# Patient Record
Sex: Male | Born: 1965 | Race: White | Hispanic: No | Marital: Married | Smoking: Current every day smoker
Health system: Southern US, Community
[De-identification: ages and names within clinical notes are randomized; demographics above are authoritative.]

## PROBLEM LIST (undated history)

## (undated) DIAGNOSIS — C801 Malignant (primary) neoplasm, unspecified: Secondary | ICD-10-CM

---

## 2000-08-19 HISTORY — PX: BRAIN TUMOR EXCISION: SHX577

## 2010-08-19 HISTORY — PX: BRAIN SURGERY: SHX531

## 2011-08-09 DIAGNOSIS — Z86010 Personal history of colonic polyps: Secondary | ICD-10-CM | POA: Insufficient documentation

## 2011-10-27 DIAGNOSIS — G4733 Obstructive sleep apnea (adult) (pediatric): Secondary | ICD-10-CM | POA: Insufficient documentation

## 2011-10-27 DIAGNOSIS — Z9989 Dependence on other enabling machines and devices: Secondary | ICD-10-CM | POA: Insufficient documentation

## 2011-11-04 DIAGNOSIS — D481 Neoplasm of uncertain behavior of connective and other soft tissue: Secondary | ICD-10-CM | POA: Insufficient documentation

## 2012-02-23 DIAGNOSIS — H4900 Third [oculomotor] nerve palsy, unspecified eye: Secondary | ICD-10-CM | POA: Insufficient documentation

## 2012-03-11 DIAGNOSIS — J309 Allergic rhinitis, unspecified: Secondary | ICD-10-CM | POA: Insufficient documentation

## 2012-06-24 DIAGNOSIS — E230 Hypopituitarism: Secondary | ICD-10-CM | POA: Insufficient documentation

## 2012-07-27 DIAGNOSIS — K648 Other hemorrhoids: Secondary | ICD-10-CM | POA: Insufficient documentation

## 2012-09-03 DIAGNOSIS — H4900 Third [oculomotor] nerve palsy, unspecified eye: Secondary | ICD-10-CM | POA: Insufficient documentation

## 2012-10-05 DIAGNOSIS — Z982 Presence of cerebrospinal fluid drainage device: Secondary | ICD-10-CM | POA: Insufficient documentation

## 2012-11-30 DIAGNOSIS — T85730A Infection and inflammatory reaction due to ventricular intracranial (communicating) shunt, initial encounter: Secondary | ICD-10-CM | POA: Insufficient documentation

## 2014-08-18 DIAGNOSIS — E291 Testicular hypofunction: Secondary | ICD-10-CM | POA: Insufficient documentation

## 2014-09-13 DIAGNOSIS — H04123 Dry eye syndrome of bilateral lacrimal glands: Secondary | ICD-10-CM | POA: Insufficient documentation

## 2015-02-15 DIAGNOSIS — R718 Other abnormality of red blood cells: Secondary | ICD-10-CM | POA: Insufficient documentation

## 2015-02-15 DIAGNOSIS — R351 Nocturia: Secondary | ICD-10-CM | POA: Insufficient documentation

## 2016-09-25 ENCOUNTER — Ambulatory Visit (INDEPENDENT_AMBULATORY_CARE_PROVIDER_SITE_OTHER): Payer: Managed Care, Other (non HMO) | Admitting: Osteopathic Medicine

## 2016-09-25 ENCOUNTER — Encounter: Payer: Self-pay | Admitting: Osteopathic Medicine

## 2016-09-25 VITALS — BP 105/56 | HR 71 | Ht 69.0 in | Wt 188.0 lb

## 2016-09-25 DIAGNOSIS — Z87898 Personal history of other specified conditions: Secondary | ICD-10-CM | POA: Diagnosis not present

## 2016-09-25 DIAGNOSIS — D563 Thalassemia minor: Secondary | ICD-10-CM | POA: Insufficient documentation

## 2016-09-25 DIAGNOSIS — Z8719 Personal history of other diseases of the digestive system: Secondary | ICD-10-CM | POA: Insufficient documentation

## 2016-09-25 DIAGNOSIS — E230 Hypopituitarism: Secondary | ICD-10-CM

## 2016-09-25 DIAGNOSIS — K5900 Constipation, unspecified: Secondary | ICD-10-CM | POA: Insufficient documentation

## 2016-09-25 DIAGNOSIS — Z982 Presence of cerebrospinal fluid drainage device: Secondary | ICD-10-CM | POA: Diagnosis not present

## 2016-09-25 DIAGNOSIS — Z0000 Encounter for general adult medical examination without abnormal findings: Secondary | ICD-10-CM

## 2016-09-25 DIAGNOSIS — R718 Other abnormality of red blood cells: Secondary | ICD-10-CM

## 2016-09-25 NOTE — Progress Notes (Signed)
HPI: Brandon Huerta is a 51 y.o. male  who presents to [place] today, 09/25/16,  for chief complaint of:  Chief Complaint  Patient presents with  . Establish Care    No complaints today. Here to establish care.   S/p resection/radiation of multiple brain tumors, following with [facility]. Routine MRIs. Neurology is prescribing pain medcations and anxiety medications at this point. Hx VP shunt as well, Hx peritonitis ?d/t complications from the VP shunt.   Anxiety: deals  with frustrations and anxieties at work. Sparing use clonazepam. Not interested at this time in SSRI or other daily therapy.   Chronic pain: residual effects from brain surgeries and radiation.   Anemia: Hx thalassemia trait, stable.   Hypogonadism: on testosterone from Endocrinologist. Also following pituitary hormone levels, damage d/t surgeries/tumors.    Past medical history, surgical history, social history and family history reviewed.  Patient Active Problem List   Diagnosis Date Noted  . Constipation 09/25/2016  . History of peritonitis 09/25/2016  . Thalassemia trait 09/25/2016  . History of brain tumor 09/25/2016  . Microcytosis 02/15/2015  . Nocturia 02/15/2015  . 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 Hypogonadism in male 08/18/2014  . Infection of VP (ventriculoperitoneal) shunt ([place]) 11/30/2012  . S/P ventriculoperitoneal shunt 10/05/2012  . 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 Internal hemorrhoids 07/27/2012  . 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 Allergic rhinitis 03/11/2012  . Third nerve palsy 02/23/2012  . 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 OSA on CPAP 10/27/2011  . History of adenomatous polyp of colon 08/09/2011    Current medication list and allergy/intolerance information reviewed.   Outpatient Encounter Prescriptions as of 09/25/2016  Medication Sig  . clonazePAM (KLONOPIN) 0.5 MG tablet Take 0.5 mg by mouth as  needed.  Marland Kitchen HYDROcodone-acetaminophen (NORCO) 10-325 MG tablet Take 1 tablet by mouth as needed.   . psyllium (METAMUCIL) 58.6 % powder Take 58.6 packets by mouth as needed.  . testosterone cypionate (DEPOTESTOSTERONE CYPIONATE) 200 MG/ML injection Inject 0.6 mLs into the muscle once a week.   No facility-administered encounter medications on file as of 09/25/2016.      No Known Allergies    Review of Systems:  Constitutional: No recent illness  HEENT: No  headache, no vision change  Cardiac: No  chest pain, No  pressure, No palpitations  Respiratory:  No  shortness of breath. No  Cough  Gastrointestinal: No  abdominal pain, no change on bowel habits  Musculoskeletal: No new myalgia/arthralgia  Skin: No  Rash  Hem/Onc: No  easy bruising/bleeding, No  abnormal lumps/bumps  Neurologic: No  weakness, No  Dizziness  Psychiatric: No  concerns with depression, No  concerns with anxiety  Exam:  BP (!) 105/56   Pulse 71   Ht 5\' 9"  (1.753 m)   Wt 188 lb (85.3 kg)   BMI 27.76 kg/m   Constitutional: VS see above. General Appearance: alert, well-developed, well-nourished, NAD  Eyes: Normal lids and conjunctive, non-icteric sclera  Ears, Nose, Mouth, Throat: MMM, Normal external inspection ears/nares/mouth/lips/gums.  Neck: No masses, trachea midline.   Respiratory: Normal respiratory effort. no wheeze, no rhonchi, no rales  Cardiovascular: S1/S2 normal, no murmur, no rub/gallop auscultated. RRR.   Musculoskeletal: Gait normal. Symmetric and independent movement of all extremities  Neurological: Normal balance/coordination. No tremor.  Skin: warm, dry, intact.   Psychiatric: Normal judgment/insight. Normal mood and affect. Oriented x3.    ASSESSMENT/PLAN:   Pleasant patient here to  establish care today. Not in need of refills, no urgent labs needed. Annual physical / preventive care due, will plan to complete this in the next few months - labs ordered today for him to  have done ahead of annual, but annual physical was not billed today.   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 .  Visit summary with medication list and pertinent instructions was printed for patient to review, alert [place] if any changes needed. All questions at time of visit were answered - patient instructed to contact office with any additional concerns.   Note: Total time spent 30 minutes, greater than 50% of the visit was spent face-to-face counseling and coordinating care for the following: The primary encounter diagnosis was History of brain tumor. Diagnoses of Thalassemia trait, S/P ventriculoperitoneal shunt, History of peritonitis were also pertinent to this visit.Marland Kitchen

## 2016-10-03 ENCOUNTER — Ambulatory Visit (INDEPENDENT_AMBULATORY_CARE_PROVIDER_SITE_OTHER): Payer: Managed Care, Other (non HMO) | Admitting: Podiatry

## 2016-10-03 ENCOUNTER — Encounter: Payer: Self-pay | Admitting: Podiatry

## 2016-10-03 VITALS — BP 127/81 | HR 74 | Ht 69.0 in | Wt 188.0 lb

## 2016-10-03 DIAGNOSIS — B353 Tinea pedis: Secondary | ICD-10-CM | POA: Diagnosis not present

## 2016-10-03 DIAGNOSIS — M21969 Unspecified acquired deformity of unspecified lower leg: Secondary | ICD-10-CM | POA: Diagnosis not present

## 2016-10-03 DIAGNOSIS — L600 Ingrowing nail: Secondary | ICD-10-CM | POA: Diagnosis not present

## 2016-10-03 DIAGNOSIS — L03032 Cellulitis of left toe: Secondary | ICD-10-CM | POA: Diagnosis not present

## 2016-10-03 NOTE — Patient Instructions (Addendum)
Seen for painful ingrown nail on left great toe and pain in right foot arch area. Ingrown nail removed.  Soak daily in Epson salt water for 10-15 nimutes till the pain subside.   For smelly skin,    may use Salsun blue shampoo scrub daily during the each shower.  For pain in right foot arch: Noted of weakened first metatarsal bone with bunion formation R>L. May benefit from custom orthotics.

## 2016-10-03 NOTE — Progress Notes (Signed)
SUBJECTIVE: 51 y.o. year old male presents stating left toe is in trouble. Left lateral border is painful as if been hit by a hammer, duration of 3 weeks. Been using oil for smelly and thlete's foot condition. Also used Lotrim cream. Been having a sharp pain under the arch of right foot. On feet long hours doing flooring work.  REVIEW OF SYSTEMS: Pertinent items noted in HPI and remainder of comprehensive ROS otherwise negative.  OBJECTIVE: DERMATOLOGIC EXAMINATION: Inflamed ingrown nail left great toe lateral border, symptomatic. Positive for subjective Itch and smelly foot bilateral.  VASCULAR EXAMINATION OF LOWER LIMBS: Pedal pulses: All pedal pulses are palpable with normal pulsation.  Positive of mild erythema lateral border left great toe nail area. Temperature gradient from tibial crest to dorsum of foot is within normal bilateral.  NEUROLOGIC EXAMINATION OF THE LOWER LIMBS: Achilles DTR is present and within normal. Monofilament (Semmes-Weinstein 10-gm) sensory testing positive 6 out of 6, bilateral. Vibratory sensations(128Hz  turning fork) intact at medial and lateral forefoot bilateral.  Sharp and Dull discriminatory sensations at the plantar ball of hallux is intact bilateral.   MUSCULOSKELETAL EXAMINATION: Positive for enlarged first metatarsophalangeal joint R>L. Positive of elevated first metatarsal bone bilateral. Positive of STJ hyperpronation upon loading of foot, bilateral. Pain at medial longitudinal arch area right foot.  ASSESSMENT: Symptomatic ingrown nail left great toe lateral border with paronychia. Tinea pedis bilateral. Metatarso primus elevatus bilateral. Hallux limitus bilateral. Bunion deformity bilateral. Plantar fasciitis right.  PLAN: Reviewed clinical findings and available treatment options. Offending lateral border of nail removed and paronychia drained. Patient understands this is a temporary procedure and there is a good change for  recurrent ingrown nail.  Discussed daily scrub with Salsunblue shampoo after each shower to reduce fungal infection. May benefit from custom orthotics to accommodate biomechanical misalignment and plantar fasciitis.

## 2016-10-16 LAB — CBC WITH DIFFERENTIAL/PLATELET
BASOS PCT: 1 %
Basophils Absolute: 62 cells/uL (ref 0–200)
EOS PCT: 3 %
Eosinophils Absolute: 186 cells/uL (ref 15–500)
HCT: 40.4 % (ref 38.5–50.0)
HEMOGLOBIN: 12.9 g/dL — AB (ref 13.2–17.1)
LYMPHS ABS: 1550 {cells}/uL (ref 850–3900)
Lymphocytes Relative: 25 %
MCH: 22.1 pg — AB (ref 27.0–33.0)
MCHC: 31.9 g/dL — ABNORMAL LOW (ref 32.0–36.0)
MCV: 69.2 fL — ABNORMAL LOW (ref 80.0–100.0)
MPV: 10 fL (ref 7.5–12.5)
Monocytes Absolute: 558 cells/uL (ref 200–950)
Monocytes Relative: 9 %
NEUTROS ABS: 3844 {cells}/uL (ref 1500–7800)
Neutrophils Relative %: 62 %
Platelets: 238 10*3/uL (ref 140–400)
RBC: 5.84 MIL/uL — ABNORMAL HIGH (ref 4.20–5.80)
RDW: 16.8 % — ABNORMAL HIGH (ref 11.0–15.0)
WBC: 6.2 10*3/uL (ref 3.8–10.8)

## 2016-10-16 LAB — LIPID PANEL
Cholesterol: 130 mg/dL (ref <200)
HDL: 32 mg/dL — ABNORMAL LOW (ref >40)
LDL CALC: 77 mg/dL (ref <100)
Total CHOL/HDL Ratio: 4.1 Ratio (ref <5.0)
Triglycerides: 105 mg/dL (ref <150)
VLDL: 21 mg/dL (ref <30)

## 2016-10-16 LAB — COMPLETE METABOLIC PANEL WITH GFR
ALBUMIN: 4.2 g/dL (ref 3.6–5.1)
ALT: 31 U/L (ref 9–46)
AST: 29 U/L (ref 10–40)
Alkaline Phosphatase: 64 U/L (ref 40–115)
BILIRUBIN TOTAL: 0.5 mg/dL (ref 0.2–1.2)
BUN: 16 mg/dL (ref 7–25)
CO2: 28 mmol/L (ref 20–31)
CREATININE: 1.19 mg/dL (ref 0.60–1.35)
Calcium: 9.2 mg/dL (ref 8.6–10.3)
Chloride: 107 mmol/L (ref 98–110)
GFR, Est African American: 82 mL/min (ref >=60)
GFR, Est Non African American: 71 mL/min (ref >=60)
GLUCOSE: 93 mg/dL (ref 65–99)
Potassium: 4.3 mmol/L (ref 3.5–5.3)
SODIUM: 142 mmol/L (ref 135–146)
TOTAL PROTEIN: 6.8 g/dL (ref 6.1–8.1)

## 2016-10-16 LAB — TSH: TSH: 2.38 m[IU]/L (ref 0.40–4.50)

## 2017-02-19 ENCOUNTER — Emergency Department
Admission: EM | Admit: 2017-02-19 | Discharge: 2017-02-19 | Disposition: A | Discharge status: Home / Self Care | Payer: Managed Care, Other (non HMO) | Source: Home / Self Care | Attending: Family Medicine | Admitting: Family Medicine

## 2017-02-19 ENCOUNTER — Emergency Department (INDEPENDENT_AMBULATORY_CARE_PROVIDER_SITE_OTHER): Payer: Managed Care, Other (non HMO)

## 2017-02-19 ENCOUNTER — Encounter: Payer: Self-pay | Admitting: *Deleted

## 2017-02-19 DIAGNOSIS — L089 Local infection of the skin and subcutaneous tissue, unspecified: Secondary | ICD-10-CM

## 2017-02-19 DIAGNOSIS — S4991XA Unspecified injury of right shoulder and upper arm, initial encounter: Secondary | ICD-10-CM

## 2017-02-19 DIAGNOSIS — S80862A Insect bite (nonvenomous), left lower leg, initial encounter: Secondary | ICD-10-CM

## 2017-02-19 DIAGNOSIS — L03032 Cellulitis of left toe: Secondary | ICD-10-CM

## 2017-02-19 DIAGNOSIS — L600 Ingrowing nail: Secondary | ICD-10-CM | POA: Diagnosis not present

## 2017-02-19 DIAGNOSIS — M25511 Pain in right shoulder: Secondary | ICD-10-CM | POA: Diagnosis not present

## 2017-02-19 DIAGNOSIS — S46911A Strain of unspecified muscle, fascia and tendon at shoulder and upper arm level, right arm, initial encounter: Secondary | ICD-10-CM

## 2017-02-19 DIAGNOSIS — W57XXXA Bitten or stung by nonvenomous insect and other nonvenomous arthropods, initial encounter: Secondary | ICD-10-CM

## 2017-02-19 HISTORY — DX: Malignant (primary) neoplasm, unspecified: C80.1

## 2017-02-19 MED ORDER — MUPIROCIN 2 % EX OINT: TOPICAL_OINTMENT | CUTANEOUS | 0 refills | Status: DC

## 2017-02-19 MED ORDER — DOXYCYCLINE HYCLATE 100 MG PO CAPS: 100.0000 mg | ORAL_CAPSULE | Freq: Two times a day (BID) | ORAL | 0 refills | Status: DC

## 2017-02-19 MED ORDER — TRIAMCINOLONE ACETONIDE 0.1 % EX CREA: 1.0000 "application " | TOPICAL_CREAM | Freq: Two times a day (BID) | CUTANEOUS | 0 refills | Status: DC

## 2017-02-19 NOTE — Discharge Instructions (Signed)
°  Please take antibiotics as prescribed and be sure to complete entire course even if you start to feel better to ensure infection does not come back. ° °

## 2017-02-19 NOTE — ED Provider Notes (Signed)
CSN: [id number]     Arrival date & time 02/19/17  1251 History   First MD Initiated Contact with Patient 02/19/17 1308     Chief Complaint  Patient presents with  . Toe Pain  . Insect Bite   (Consider location/radiation/quality/duration/timing/severity/associated sxs/prior Treatment) HPI Brandon Huerta is a 51 y.o. male presenting to UC with c/o 1 week of gradually worsening Left great toe pain secondary to an ingrown nail.  His wife attempted to cut the corner of his nail off but pain has continued to worsen.  Pain is aching and sore, worse with touch.  He was seen by podiatry about 2 months ago and had part of the same nail removed then.  He has not tried any medications for current symptoms.  He is also c/o gradually worsening sore and itchy insect bite to Left lower shin that occurred about 4 days ago.  He had been putting Neosporin on the area w/o relief.  Denies fever, chills, n/v/d.    Past Medical History:  Diagnosis Date  . Cancer [facility])    brain   Past Surgical History:  Procedure Laterality Date  . BRAIN SURGERY  2012  . BRAIN TUMOR EXCISION  2002   Family History  Problem Relation Age of Onset  . High blood pressure Mother   . Cancer Father        PROSTATE  . Depression Brother    Social History  Substance Use Topics  . Smoking status: Never Smoker  . Smokeless tobacco: Never Used  . Alcohol use No    Review of Systems  Constitutional: Negative for chills and fever.  Musculoskeletal: Positive for joint swelling. Negative for arthralgias and myalgias.  Skin: Positive for color change and wound.    Allergies  Patient has no known allergies.  Home Medications   Prior to Admission medications   Medication Sig Start Date End Date Taking? Authorizing Provider  HYDROcodone-acetaminophen (NORCO) 10-325 MG tablet Take 1 tablet by mouth as needed.  10/15/11  Yes [provider]  clonazePAM (KLONOPIN) 0.5 MG tablet Take 0.5 mg by mouth as needed. 09/12/15    [provider]  doxycycline (VIBRAMYCIN) 100 MG capsule Take 1 capsule (100 mg total) by mouth 2 (two) times daily. One po bid x 7 days 02/19/17   Noe Gens, PA-C  mupirocin ointment Drue Stager) 2 % Apply twice daily to left great toe for 7 days 02/19/17   Noe Gens, PA-C  psyllium (METAMUCIL) 58.6 % powder Take 58.6 packets by mouth as needed.    [provider]  testosterone cypionate (DEPOTESTOSTERONE CYPIONATE) 200 MG/ML injection Inject 0.6 mLs into the muscle once a week. 07/06/14   [provider]  triamcinolone cream (KENALOG) 0.1 % Apply 1 application topically 2 (two) times daily. 02/19/17   Noe Gens, PA-C   Meds Ordered and Administered this Visit  Medications - No data to display  BP (!) 128/92 (BP Location: Left Arm)   Pulse 71   Temp 98.5 F (36.9 C) (Oral)   Resp 16   Ht 5\' 9"  (1.753 m)   Wt 191 lb (86.6 kg)   SpO2 96%   BMI 28.21 kg/m  No data found.   Physical Exam  Constitutional: He is oriented to person, place, and time. He appears well-developed and well-nourished. No distress.  HENT:  Head: Normocephalic and atraumatic.  Eyes: EOM are normal.  Neck: Normal range of motion.  Cardiovascular: Normal rate.   Pulmonary/Chest: Effort normal.  Musculoskeletal: Normal range of motion.       Feet:  Neurological: He is alert and oriented to person, place, and time.  Skin: Skin is warm and dry. He is not diaphoretic. There is erythema.     Left great toe: ingrown nail to lateral aspect of toe with surrounding erythema, edema and tenderness at tip of nailbed. No bleeding or drainage.  Left lower leg, anterior aspect: 2cm area of superficial abrasion and erythema. Mildly tender. No underlying induration or fluctuance.   Psychiatric: He has a normal mood and affect. His behavior is normal.  Nursing note and vitals reviewed.   Urgent Care Course     Procedures (including critical care time)  Labs Review Labs Reviewed - No  data to display  Imaging Review No results found.   MDM   1. Ingrown nail of great toe of left foot   2. Cellulitis of great toe of left foot   3. Infected insect bite of lower leg, left, initial encounter    Hx and exam c/w infected ingrown Left great toenail and infected insect bite of Left lower leg.  Will start pt on antibiotics Home care instructions F/u with Podiatrist if wanting nail removed, however, encouraged to allow nail to continue to grow outward as swelling subsides. Soak toe 2-3 times daily, gently massaging back the skin around nail.  Rx: Doxycycline, mupirocin (for toe), triamcinolone for insect bite  F/u with PCP as needed.    Noe Gens, [place] 02/19/17 1434

## 2017-02-19 NOTE — ED Triage Notes (Signed)
Pt c/o LT great toe inflamed and pain x 2 wks. Reports seeing a podiatrist 2 mths ago, they clipped the toe nail and relieved the pain. He took a 1/2 of Vicodin on the way here today. He also c/o insect bite to his LT calf x 4 days.

## 2017-02-19 NOTE — ED Triage Notes (Signed)
Pt c/o RT shoulder pain x 1 hour post fall at home. He reports that he was sitting down, missed the chair and tried to catch himself with his RT arm. He took a half of a Vicodin on the way here.

## 2017-02-19 NOTE — ED Provider Notes (Signed)
CSN: [id number]     Arrival date & time 02/19/17  1732 History   First MD Initiated Contact with Patient 02/19/17 1750     Chief Complaint  Patient presents with  . Shoulder Injury   (Consider location/radiation/quality/duration/timing/severity/associated sxs/prior Treatment) HPI Brandon Huerta is a 51 y.o. male presenting to UC with c/o sudden onset Right shoulder pain.  Pt was seen earlier in the day at [facility] for an infected ingrown toenail but has returned this evening for an unrelated injury to his Right shoulder. He reports trying to sit down onto a rolling chair, miscalculated how close he was, causing him to fall backward onto his outstretched Right arm.  Pain is worse in Right upper back/posterior shoulder. Limited ROM due to pain.  He took 1/2 tab vicodin PTA. Pt is accompanied by his wife.  Pt is Right hand dominant. Denies prior surgery or dislocation to same shoulder. Denies numbness or tingling in Right hand. No other injuries from the fall.    Past Medical History:  Diagnosis Date  . Cancer [facility])    brain   Past Surgical History:  Procedure Laterality Date  . BRAIN SURGERY  2012  . BRAIN TUMOR EXCISION  2002   Family History  Problem Relation Age of Onset  . High blood pressure Mother   . Cancer Father        PROSTATE  . Depression Brother    Social History  Substance Use Topics  . Smoking status: Never Smoker  . Smokeless tobacco: Never Used  . Alcohol use No    Review of Systems  Musculoskeletal: Positive for arthralgias, back pain (Right upper) and myalgias. Negative for neck pain and neck stiffness.  Skin: Negative for color change and wound.  Neurological: Positive for weakness (Right arm due to pain). Negative for numbness.    Allergies  Patient has no known allergies.  Home Medications   Prior to Admission medications   Medication Sig Start Date End Date Taking? Authorizing Provider  clonazePAM (KLONOPIN) 0.5 MG tablet Take 0.5 mg by mouth as  needed. 09/12/15   [provider]  doxycycline (VIBRAMYCIN) 100 MG capsule Take 1 capsule (100 mg total) by mouth 2 (two) times daily. One po bid x 7 days 02/19/17   Noe Gens, PA-C  HYDROcodone-acetaminophen [facility]) 10-325 MG tablet Take 1 tablet by mouth as needed.  10/15/11   [provider]  mupirocin ointment (BACTROBAN) 2 % Apply twice daily to left great toe for 7 days 02/19/17   Noe Gens, PA-C  psyllium (METAMUCIL) 58.6 % powder Take 58.6 packets by mouth as needed.    [provider]  testosterone cypionate (DEPOTESTOSTERONE CYPIONATE) 200 MG/ML injection Inject 0.6 mLs into the muscle once a week. 07/06/14   [provider]  triamcinolone cream (KENALOG) 0.1 % Apply 1 application topically 2 (two) times daily. 02/19/17   Noe Gens, PA-C   Meds Ordered and Administered this Visit  Medications - No data to display  BP 129/80 (BP Location: Left Arm)   Pulse 92   Temp 98.5 F (36.9 C) (Oral)   Resp 18   SpO2 96%  No data found.   Physical Exam  Constitutional: He is oriented to person, place, and time. He appears well-developed and well-nourished.  Pt sitting on exam bed guarding Right arm, appears uncomfortable but is alert and cooperative  HENT:  Head: Normocephalic and atraumatic.  Eyes: EOM are normal.  Neck: Normal range of motion.  Cardiovascular:  Normal rate.   Pulses:      Radial pulses are 2+ on the right side.  Pulmonary/Chest: Effort normal.  Musculoskeletal: He exhibits tenderness.  Right arm: muscular shoulders, questionable anterior deformity of Right shoulder. Tenderness to posterior shoulder. Unable to adducted Right hand to Left shoulder due to pain. Full ROM Right elbow, non-tender. No tenderness to Right hand or wrist. 5/5 grip strength.  Neurological: He is alert and oriented to person, place, and time.  Skin: Skin is warm and dry. Capillary refill takes less than 2 seconds. He is not diaphoretic.   Psychiatric: He has a normal mood and affect. His behavior is normal.  Nursing note and vitals reviewed.   Urgent Care Course     Procedures (including critical care time)  Labs Review Labs Reviewed - No data to display  Imaging Review Dg Shoulder Right  Result Date: 02/19/2017 CLINICAL DATA:  51 year-old male fell approx an hour ago when he went to sit in a chair and missed the chair falling back and caught himself with his RIGHT arm and heard a "click". C/O RIGHT shoulder pain. EXAM: RIGHT SHOULDER - 2+ VIEW COMPARISON:  None. FINDINGS: There is no evidence of fracture or dislocation. There is no evidence of arthropathy or other focal bone abnormality. Soft tissues are unremarkable. IMPRESSION: Negative. Electronically Signed   By: Nolon Nations M.D.   On: 02/19/2017 18:17     MDM   1. Right shoulder strain, initial encounter   2. Right shoulder injury, initial encounter    Plain films: negative for fracture or dislocation Reassured pt of plain films.  Placed in sling for comfort Pt has Vicodin at home prescribed by his neurologist.  Encouraged to rest, ice, and may take ibuprofen in addition to the vicodin. Shoulder ROM exercises provided Encouraged f/u with Sports Medicine next week. Wife questioning about MRI. Advised pt and wife MRI truck is available to [facility] on Mondays.     Noe Gens, [place] 02/19/17 1924

## 2017-02-19 NOTE — Discharge Instructions (Signed)
° °  It is recommended you use a cool compress on shoulder 2-3 times daily for at least 15-20 minutes at a time to help with inflammation and pain.    As pain slowly subsides, you should start the home exercises to help prevent your shoulder from becoming stiff and "frozen" which can make pain worse.    Avoid heavy lifting or over head reaching for at least a week, or until pain resolves.  MRI machine is at [facility] on Mondays. When you call to schedule an appointment with Dr. Dianah Field, Sports Medicine, you may want to see if he, or his colleague, Dr. Georgina Snell, have any openings on Monday.  If range of motion is not improving but pain is, an MRI may be indicated to check for a rotator cuff tear.

## 2017-02-20 ENCOUNTER — Ambulatory Visit (INDEPENDENT_AMBULATORY_CARE_PROVIDER_SITE_OTHER): Payer: Managed Care, Other (non HMO) | Admitting: Sports Medicine

## 2017-02-20 ENCOUNTER — Encounter: Payer: Self-pay | Admitting: Sports Medicine

## 2017-02-20 DIAGNOSIS — Z9889 Other specified postprocedural states: Secondary | ICD-10-CM | POA: Insufficient documentation

## 2017-02-20 DIAGNOSIS — IMO0001 Reserved for inherently not codable concepts without codable children: Secondary | ICD-10-CM

## 2017-02-20 DIAGNOSIS — S46811A Strain of other muscles, fascia and tendons at shoulder and upper arm level, right arm, initial encounter: Secondary | ICD-10-CM

## 2017-02-20 MED ORDER — OXYCODONE-ACETAMINOPHEN 10-325 MG PO TABS: 1.0000 | ORAL_TABLET | Freq: Three times a day (TID) | ORAL | 0 refills | Status: DC | PRN

## 2017-02-20 NOTE — Assessment & Plan Note (Signed)
Suspect severe, full-thickness and retracted supraspinatus tear after the fall. MRI, sling, oxycodone for pain. Treatment will depend on MRI results. He gets MRIs cheaper at [facility], he does have a brain MRI scheduled for Monday at [facility] so ideally his shoulder MRI gets scheduled for Monday as well as the same place.

## 2017-02-20 NOTE — Progress Notes (Signed)
   Subjective:    I'm seeing this patient as a consultation for:  Dr. Theone Murdoch, Dr. Emeterio Reeve  CC: Right shoulder pain  HPI: Couple of days ago this 51 year old male fell onto outstretched right hand, he had immediate pain and a popping sensation in his shoulder followed by inability to use the shoulder. He was seen in urgent care, x-rays were negative, pain continues to be severe. Localized over the deltoid, worse with most motions.  Past medical history:  Negative.  See flowsheet/record as well for more information.  Surgical history: Negative.  See flowsheet/record as well for more information.  Family history: Negative.  See flowsheet/record as well for more information.  Social history: Negative.  See flowsheet/record as well for more information.  Allergies, and medications have been entered into the medical record, reviewed, and no changes needed.   Review of Systems: No headache, visual changes, nausea, vomiting, diarrhea, constipation, dizziness, abdominal pain, skin rash, fevers, chills, night sweats, weight loss, swollen lymph nodes, body aches, joint swelling, muscle aches, chest pain, shortness of breath, mood changes, visual or auditory hallucinations.   Objective:   General: Well Developed, well nourished, and in no acute distress.  Neuro/Psych: Alert and oriented x3, extra-ocular muscles intact, able to move all 4 extremities, sensation grossly intact. Skin: Warm and dry, no rashes noted.  Respiratory: Not using accessory muscles, speaking in full sentences, trachea midline.  Cardiovascular: Pulses palpable, no extremity edema. Abdomen: Does not appear distended. Right shoulder: Visibly and palpably swollen, tender to palpation of the greater tuberosity of the humerus, he has essentially 0 strength to abduction and external rotation, good strength to internal rotation with a negative belly press.  Procedure: Diagnostic Ultrasound of  right shoulder Device: GE  Logiq E  Findings: I am unable to visualize the supraspinatus Images permanently stored and available for review in the ultrasound unit.  Impression: Full-thickness, retracted supraspinatus tear, acute.  Impression and Recommendations:   This case required medical decision making of moderate complexity.  Supraspinatus tendon tear, right, initial encounter Suspect severe, full-thickness and retracted supraspinatus tear after the fall. MRI, sling, oxycodone for pain. Treatment will depend on MRI results. He gets MRIs cheaper at [facility], he does have a brain MRI scheduled for Monday at [facility] so ideally his shoulder MRI gets scheduled for Monday as well as the same place.

## 2017-03-05 ENCOUNTER — Telehealth: Payer: Self-pay | Admitting: Sports Medicine

## 2017-03-05 DIAGNOSIS — M75120 Complete rotator cuff tear or rupture of unspecified shoulder, not specified as traumatic: Secondary | ICD-10-CM

## 2017-03-05 NOTE — Telephone Encounter (Signed)
Left message for Brandon Huerta with his MRI results, he will call me back regarding surgical preferences. He has full-thickness, retracted supraspinatus and infraspinatus tears that need operative repair.

## 2017-03-05 NOTE — Addendum Note (Signed)
Addended by: Silverio Decamp on: 03/05/2017 01:20 PM   Modules accepted: Orders

## 2017-03-05 NOTE — Telephone Encounter (Signed)
Discussed with patient, agreeable with referral to Dr. Tamera Punt. He will bring the MRI disc.

## 2017-03-07 ENCOUNTER — Telehealth: Payer: Self-pay | Admitting: Sports Medicine

## 2017-03-07 MED ORDER — VARENICLINE TARTRATE 0.5 MG X 11 & 1 MG X 42 PO MISC: ORAL | 0 refills | Status: DC

## 2017-03-07 NOTE — Telephone Encounter (Signed)
Pt called clinic today requesting a Rx for Chantix. Pt states he consulted with an ortho surgeon who recommended the Pt quit. Spoke with treating Provider, Rx written.

## 2017-04-03 ENCOUNTER — Encounter: Payer: Self-pay | Admitting: Sports Medicine

## 2017-04-03 ENCOUNTER — Ambulatory Visit (INDEPENDENT_AMBULATORY_CARE_PROVIDER_SITE_OTHER): Payer: Managed Care, Other (non HMO) | Admitting: Sports Medicine

## 2017-04-03 DIAGNOSIS — Z9989 Dependence on other enabling machines and devices: Secondary | ICD-10-CM | POA: Diagnosis not present

## 2017-04-03 DIAGNOSIS — S46811A Strain of other muscles, fascia and tendons at shoulder and upper arm level, right arm, initial encounter: Secondary | ICD-10-CM | POA: Diagnosis not present

## 2017-04-03 DIAGNOSIS — Z9889 Other specified postprocedural states: Secondary | ICD-10-CM | POA: Diagnosis not present

## 2017-04-03 DIAGNOSIS — IMO0001 Reserved for inherently not codable concepts without codable children: Secondary | ICD-10-CM

## 2017-04-03 DIAGNOSIS — G4733 Obstructive sleep apnea (adult) (pediatric): Secondary | ICD-10-CM

## 2017-04-03 MED ORDER — OXYCODONE-ACETAMINOPHEN 10-325 MG PO TABS: 1.0000 | ORAL_TABLET | Freq: Every day | ORAL | 0 refills | Status: DC

## 2017-04-03 NOTE — Progress Notes (Signed)
  Subjective:    CC:  Shoulder issue  HPI: Brandon Huerta is a pleasant 51 year old male, he had a right rotator cuff repair 2 weeks ago.  His issues are twofold, he remained on his shoulder and felt a pop, he also has been having severe pain. His main concern however is that he has remained the repair. Pain is severe, localized, persistent.  Obstructive sleep apnea: Has not had testing along time, he is having issues with his machine and would like to be reevaluated from the beginning.  Past medical history:  Negative.  See flowsheet/record as well for more information.  Surgical history: Negative.  See flowsheet/record as well for more information.  Family history: Negative.  See flowsheet/record as well for more information.  Social history: Negative.  See flowsheet/record as well for more information.  Allergies, and medications have been entered into the medical record, reviewed, and no changes needed.   Review of Systems: No fevers, chills, night sweats, weight loss, chest pain, or shortness of breath.   Objective:    General: Well Developed, well nourished, and in no acute distress.  Neuro: Alert and oriented x3, extra-ocular muscles intact, sensation grossly intact.  HEENT: Normocephalic, atraumatic, pupils equal round reactive to light, neck supple, no masses, no lymphadenopathy, thyroid nonpalpable.  Skin: Warm and dry, no rashes. Cardiac: Regular rate and rhythm, no murmurs rubs or gallops, no lower extremity edema.  Respiratory: Clear to auscultation bilaterally. Not using accessory muscles, speaking in full sentences. Right shoulder: In a sling, incisions are healed well.  Procedure: Diagnostic Ultrasound of right shoulder Device: GE Logiq E  Findings: Noted intact but heterogenous in thin supraspinatus, infraspinatus, subscapularis were intact, biceps was well seated in the groove. Mild subacromial bursitis. Images permanently stored and available for review in the ultrasound  unit.  Impression: Intact rotator cuff repair  Impression and Recommendations:    S/P right rotator cuff repair Rotator cuff repair 2 weeks ago. Had a questionable injury, and concern for repeat injury. Ultrasound today showed intact supraspinatus,infraspinatus, subscapularis and well located biceps tendon. Adding #10 Percocet, follow-up with surgeon for further pain management.  OSA on CPAP At this point need a reevaluation and likely a new CPAP machine. Placing a new referral to [facility].  I spent 25 minutes with this patient, greater than 50% was face-to-face time counseling regarding the above diagnoses, this was separate from the time spent performing the above procedure

## 2017-04-03 NOTE — Assessment & Plan Note (Signed)
Rotator cuff repair 2 weeks ago. Had a questionable injury, and concern for repeat injury. Ultrasound today showed intact supraspinatus,infraspinatus, subscapularis and well located biceps tendon. Adding #10 Percocet, follow-up with surgeon for further pain management.

## 2017-04-03 NOTE — Assessment & Plan Note (Signed)
At this point need a reevaluation and likely a new CPAP machine. Placing a new referral to [facility].

## 2017-04-11 ENCOUNTER — Other Ambulatory Visit: Payer: Self-pay | Admitting: Sports Medicine

## 2017-04-14 MED ORDER — VARENICLINE TARTRATE 1 MG PO TABS: 1.0000 mg | ORAL_TABLET | Freq: Two times a day (BID) | ORAL | 1 refills | Status: DC

## 2017-04-14 NOTE — Telephone Encounter (Signed)
Do you want patient to continue Chantix or schedule an appointment?

## 2017-04-14 NOTE — Telephone Encounter (Signed)
Okay to continue?

## 2017-05-01 ENCOUNTER — Other Ambulatory Visit: Payer: Self-pay | Admitting: Sports Medicine

## 2017-05-19 ENCOUNTER — Other Ambulatory Visit: Payer: Self-pay

## 2017-05-19 MED ORDER — VARENICLINE TARTRATE 1 MG PO TABS: 1.0000 mg | ORAL_TABLET | Freq: Two times a day (BID) | ORAL | 1 refills | Status: DC

## 2017-05-19 NOTE — Telephone Encounter (Signed)
Patient request refill for Chantix. It was sent to [facility]. Brandon Huerta

## 2017-06-26 ENCOUNTER — Ambulatory Visit (INDEPENDENT_AMBULATORY_CARE_PROVIDER_SITE_OTHER): Payer: Managed Care, Other (non HMO)

## 2017-06-26 ENCOUNTER — Encounter: Payer: Self-pay | Admitting: Sports Medicine

## 2017-06-26 ENCOUNTER — Telehealth: Payer: Self-pay | Admitting: Sports Medicine

## 2017-06-26 ENCOUNTER — Ambulatory Visit (INDEPENDENT_AMBULATORY_CARE_PROVIDER_SITE_OTHER): Payer: Managed Care, Other (non HMO) | Admitting: Sports Medicine

## 2017-06-26 DIAGNOSIS — M545 Low back pain: Secondary | ICD-10-CM | POA: Diagnosis not present

## 2017-06-26 DIAGNOSIS — M25552 Pain in left hip: Secondary | ICD-10-CM

## 2017-06-26 MED ORDER — PREDNISONE 50 MG PO TABS: ORAL_TABLET | ORAL | 0 refills | Status: DC

## 2017-06-26 NOTE — Assessment & Plan Note (Signed)
Multifactorial, we did reproduce pain with resisted hip abduction suggesting a gluteus medius tendinosis. Likely has some lumbar spondylitic processes as well. Lumbar spine and hip x-rays, 5 days of prednisone, aggressive formal physical therapy directed at the hip abductor's. Return to me in 1 month, MR of the lumbar spine versus the hip if no better. None of the exam today was consistent with hip joint pathology.

## 2017-06-26 NOTE — Progress Notes (Signed)
   Subjective:    I'm seeing this patient as a consultation for: Dr. Emeterio Reeve  CC: Left hip pain  HPI: This is a pleasant 51 year old male, for several months he has had pain in his left back, buttock, hip.  He has been to the chiropractor for a few manipulations, the pain always comes back.  Moderate, persistent without radiation.  Worse with sitting, flexion.  No bowel or bladder dysfunction, saddle numbness.  Past medical history, Surgical history, Family history not pertinant except as noted below, Social history, Allergies, and medications have been entered into the medical record, reviewed, and no changes needed.   Review of Systems: No headache, visual changes, nausea, vomiting, diarrhea, constipation, dizziness, abdominal pain, skin rash, fevers, chills, night sweats, weight loss, swollen lymph nodes, body aches, joint swelling, muscle aches, chest pain, shortness of breath, mood changes, visual or auditory hallucinations.   Objective:   General: Well Developed, well nourished, and in no acute distress.  Neuro:  Extra-ocular muscles intact, able to move all 4 extremities, sensation grossly intact.  Deep tendon reflexes tested were normal. Psych: Alert and oriented, mood congruent with affect. ENT:  Ears and nose appear unremarkable.  Hearing grossly normal. Neck: Unremarkable overall appearance, trachea midline.  No visible thyroid enlargement. Eyes: Conjunctivae and lids appear unremarkable.  Pupils equal and round. Skin: Warm and dry, no rashes noted.  Cardiovascular: Pulses palpable, no extremity edema. Left hip: ROM IR: 60 Deg, ER: 60 Deg, Flexion: 120 Deg, Extension: 100 Deg, Abduction: 45 Deg, Adduction: 45 Deg Strength IR: 5/5, ER: 5/5, Flexion: 5/5, Extension: 5/5, Abduction: 5/5, Adduction: 5/5, reproduction of pain with resisted abduction Pelvic alignment unremarkable to inspection and palpation. Standing hip rotation and gait without trendelenburg /  unsteadiness. Greater trochanter without tenderness to palpation. No tenderness over piriformis. No SI joint tenderness and normal minimal SI movement. Back Exam:  Inspection: Unremarkable  Motion: Flexion 45 deg, Extension 45 deg, Side Bending to 45 deg bilaterally,  Rotation to 45 deg bilaterally  SLR laying: Negative  XSLR laying: Negative  Palpable tenderness: None. FABER: negative. Sensory change: Gross sensation intact to all lumbar and sacral dermatomes.  Reflexes: 2+ at both patellar tendons, 2+ at achilles tendons, Babinski's downgoing.  Strength at foot  Plantar-flexion: 5/5 Dorsi-flexion: 5/5 Eversion: 5/5 Inversion: 5/5  Leg strength  Quad: 5/5 Hamstring: 5/5 Hip flexor: 5/5 Hip abductors: 5/5  Gait unremarkable.  X-rays reviewed, hips are normal, multilevel lumbar degenerative changes and facet arthritis.  Impression and Recommendations:   This case required medical decision making of moderate complexity.  Left hip pain Multifactorial, we did reproduce pain with resisted hip abduction suggesting a gluteus medius tendinosis. Likely has some lumbar spondylitic processes as well. Lumbar spine and hip x-rays, 5 days of prednisone, aggressive formal physical therapy directed at the hip abductor's. Return to me in 1 month, MR of the lumbar spine versus the hip if no better. None of the exam today was consistent with hip joint pathology. ___________________________________________ Gwen Her. Dianah Field, M.D., ABFM., CAQSM. Primary Care and [place] [employer] [facility]

## 2017-06-26 NOTE — Telephone Encounter (Signed)
Patient called at 4:37 asking if he could come back in to get cortisone injection he forgot to get or ask for one when he was here for appt at 2:15pm. Can he be double booked for sometime tomorrow? Please adv

## 2017-06-26 NOTE — Telephone Encounter (Signed)
It is too early to consider an injection, but right now we are doing rehabilitation, the prednisone will help his throbbing pain, it is probably not a good idea just to go injecting for the hell of it.

## 2017-06-26 NOTE — Telephone Encounter (Signed)
Pt called clinic questioning if he should have gotten an injection at his appointment today. Pt states his hip feels like it's "pulsating" and is worried the pain will get worse before the oral Rx's can help. Will route.

## 2017-06-27 NOTE — Telephone Encounter (Signed)
Pt advised, he started the prednisone last night.

## 2017-07-01 ENCOUNTER — Telehealth: Payer: Self-pay

## 2017-07-01 DIAGNOSIS — M25552 Pain in left hip: Secondary | ICD-10-CM

## 2017-07-01 NOTE — Telephone Encounter (Signed)
Pt left VM asking to have his new PT referral sent to [facility] since he's already doing therapy there. Please assist.

## 2017-07-01 NOTE — Telephone Encounter (Signed)
Referral placed.

## 2017-07-04 ENCOUNTER — Telehealth: Payer: Self-pay

## 2017-07-04 MED ORDER — DICLOFENAC SODIUM 75 MG PO TBEC: 75.0000 mg | DELAYED_RELEASE_TABLET | Freq: Two times a day (BID) | ORAL | 3 refills | Status: AC

## 2017-07-04 MED ORDER — GABAPENTIN 300 MG PO CAPS: ORAL_CAPSULE | ORAL | 3 refills | Status: DC

## 2017-07-04 NOTE — Telephone Encounter (Signed)
He would like something for the pain in his back. He is taking 600 mg every 4-6 hours.   Patient advised of results and recommendations.

## 2017-07-04 NOTE — Telephone Encounter (Signed)
Adding Voltaren, gabapentin in an up taper.  Stop ibuprofen.

## 2017-07-04 NOTE — Telephone Encounter (Signed)
Brandon Huerta called wanting the results to his last two x-rays. He states his left this in numb and cold and has been for the last 5 days. Please advise.

## 2017-07-04 NOTE — Telephone Encounter (Signed)
Patient has been informed. Rhonda Cunningham,CMA  

## 2017-07-04 NOTE — Telephone Encounter (Signed)
There is degenerative facet arthritis in the back, hip x-rays are unremarkable.  He needs to follow through with the existing plan of physical therapy, and the medications we called in.

## 2017-07-20 ENCOUNTER — Other Ambulatory Visit: Payer: Self-pay | Admitting: Osteopathic Medicine

## 2017-07-23 ENCOUNTER — Telehealth: Payer: Self-pay | Admitting: Sports Medicine

## 2017-07-31 ENCOUNTER — Other Ambulatory Visit: Payer: Self-pay | Admitting: Physician Assistant

## 2017-10-24 ENCOUNTER — Other Ambulatory Visit: Payer: Self-pay

## 2017-10-24 MED ORDER — GABAPENTIN 300 MG PO CAPS: ORAL_CAPSULE | ORAL | 3 refills | Status: DC

## 2018-03-26 NOTE — Telephone Encounter (Signed)
Error

## 2019-07-12 IMAGING — DX DG HIP (WITH OR WITHOUT PELVIS) 2-3V*L*
3 series · 3 of 3 positions shown · non-contrast
Comparison: None.

CLINICAL DATA: Low back pain and left hip pain for 3 weeks. No
known injury.

EXAM:
DG HIP (WITH OR WITHOUT PELVIS) 2-3V LEFT

[pelvis ap]
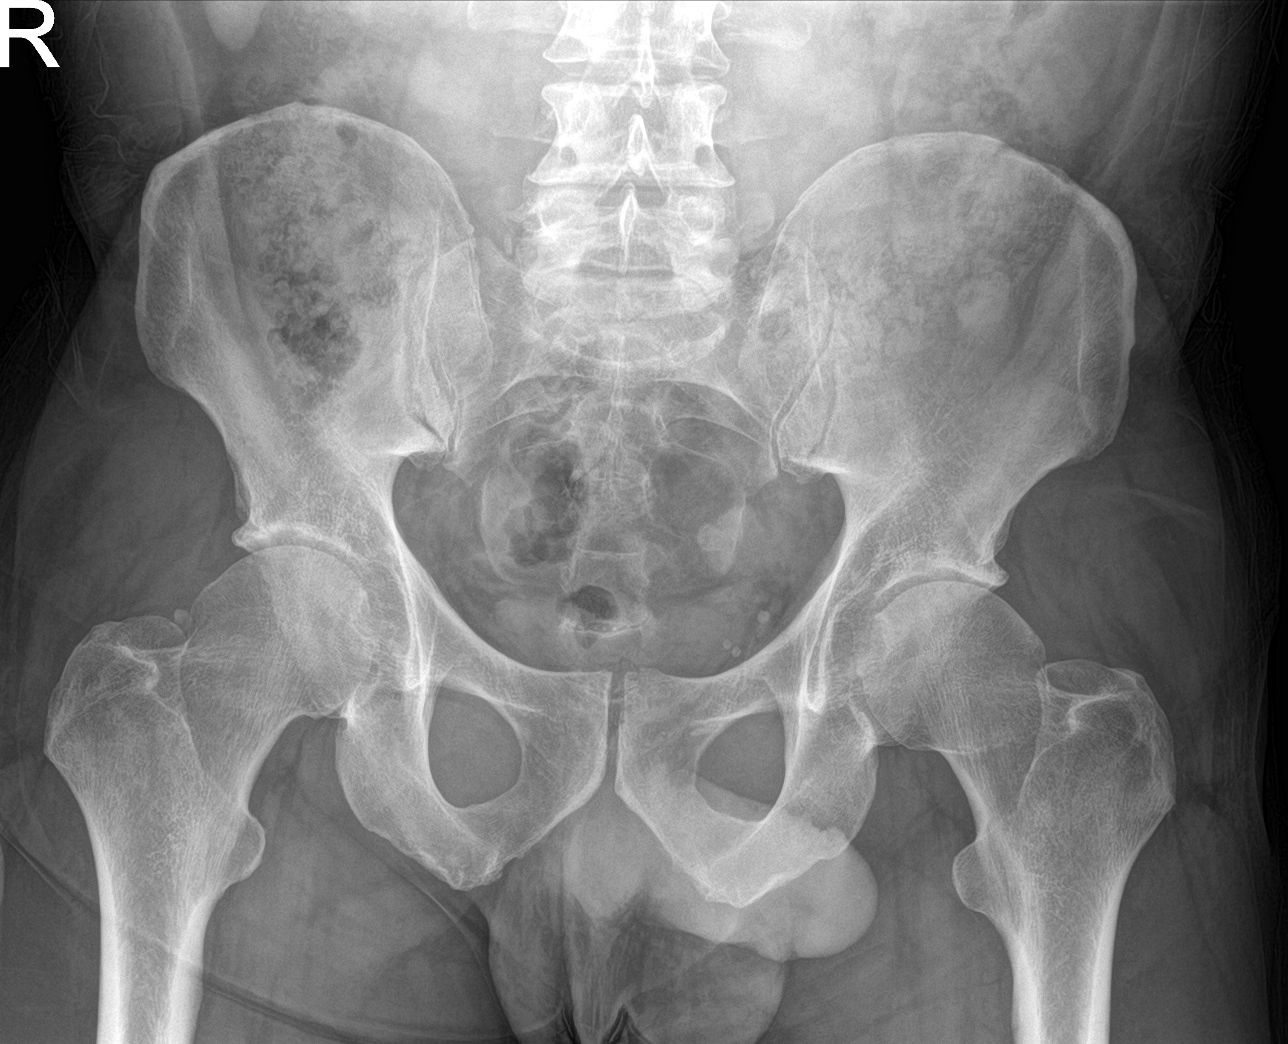

[hip ap]
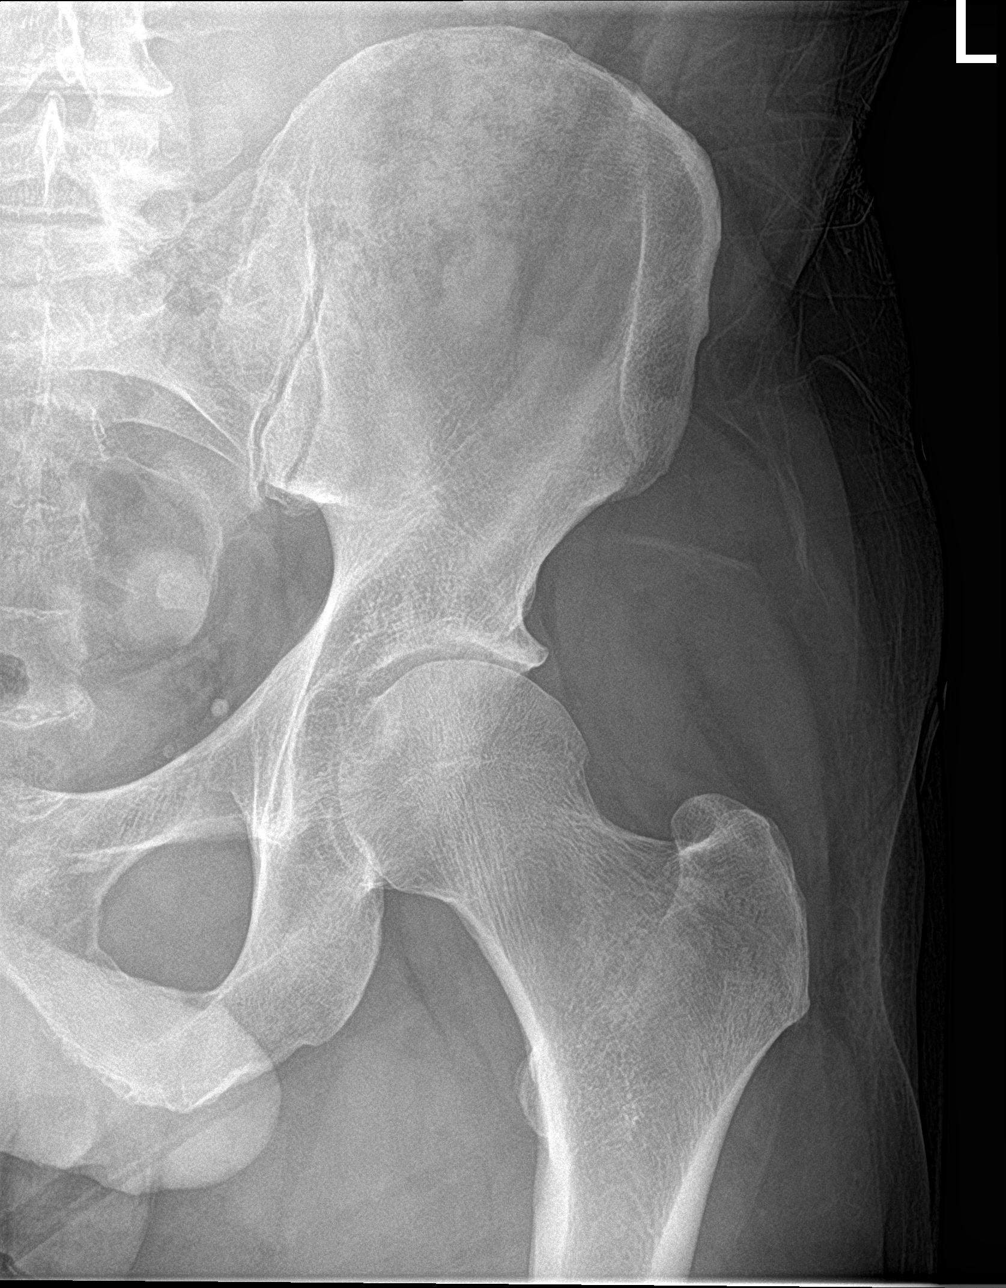

[hip lat]
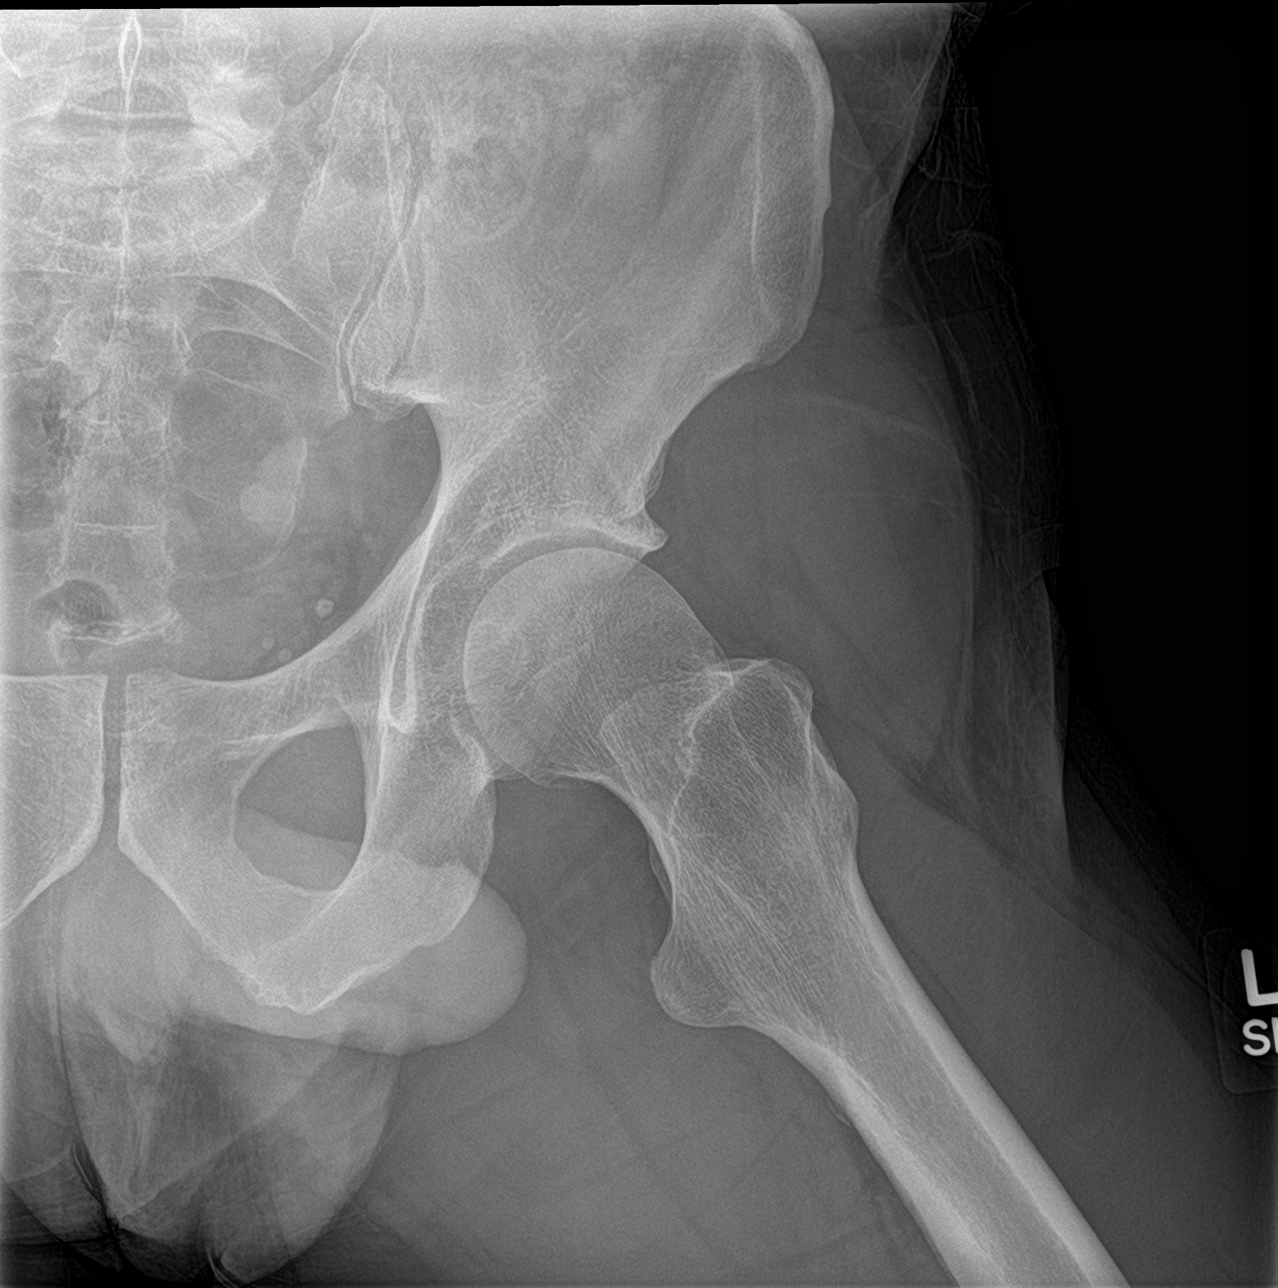

[3 of 3 positions shown; findings below may reference images not displayed]

FINDINGS: Single view of the pelvis and two views of the left hip are
provided. Osseous alignment is normal. Bone mineralization is
normal. No fracture line or displaced fracture fragment seen. No
significant degenerative change at either hip joint. Soft tissues
about the left hip are unremarkable.
IMPRESSION: Negative.

## 2020-07-11 ENCOUNTER — Encounter: Payer: Self-pay | Admitting: Family Medicine

## 2020-07-11 ENCOUNTER — Emergency Department
Admission: EM | Admit: 2020-07-11 | Discharge: 2020-07-11 | Disposition: A | Discharge status: Home / Self Care | Payer: 59 | Source: Home / Self Care

## 2020-07-11 ENCOUNTER — Other Ambulatory Visit: Payer: Self-pay

## 2020-07-11 DIAGNOSIS — R253 Fasciculation: Secondary | ICD-10-CM

## 2020-07-11 LAB — COMPLETE METABOLIC PANEL WITH GFR
AG Ratio: 1.7 (calc) (ref 1.0–2.5)
ALT: 28 U/L (ref 9–46)
AST: 29 U/L (ref 10–35)
Albumin: 4.5 g/dL (ref 3.6–5.1)
Alkaline phosphatase (APISO): 68 U/L (ref 35–144)
BUN: 17 mg/dL (ref 7–25)
CO2: 23 mmol/L (ref 20–32)
Calcium: 9.6 mg/dL (ref 8.6–10.3)
Chloride: 107 mmol/L (ref 98–110)
Creat: 1.01 mg/dL (ref 0.70–1.33)
GFR, Est African American: 99 mL/min/{1.73_m2} (ref >=60)
GFR, Est Non African American: 85 mL/min/{1.73_m2} (ref >=60)
Globulin: 2.6 g/dL (calc) (ref 1.9–3.7)
Glucose, Bld: 94 mg/dL (ref 65–99)
Potassium: 4.2 mmol/L (ref 3.5–5.3)
Sodium: 140 mmol/L (ref 135–146)
Total Bilirubin: 0.8 mg/dL (ref 0.2–1.2)
Total Protein: 7.1 g/dL (ref 6.1–8.1)

## 2020-07-11 NOTE — ED Provider Notes (Signed)
[facility]    CSN: [id number] Arrival date & time: 07/11/20  1007      History   Chief Complaint Chief Complaint  Patient presents with  . Leg numbness    HPI Brandon Huerta is a 54 y.o. male.   This is an established [place] urgent care patient, age 66.  Woke up this morning went down to get a cup of coffee and both legs started spasm and shake.  Lasted about 5 minutes. Feels fine now.  He thinks that the right leg was primarily involved with some paresthesias accompanying the muscle fasciculations.  Patient is currently receiving testosterone injections.  He recently received a prescription for a codeine cough syrup as well.  Patient has undergone a ventriculoperitoneal shunt in the past.  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  He subsequently required a VP shunt which then got infected and was removed.  Vaccinated     Past Medical History:  Diagnosis Date  . Cancer [facility])    brain    Patient Active Problem List   Diagnosis Date Noted  . Left hip pain 06/26/2017  . S/P right rotator cuff repair 02/20/2017  . Constipation 09/25/2016  . History of peritonitis 09/25/2016  . Thalassemia trait 09/25/2016  . History of brain tumor 09/25/2016  . Microcytosis 02/15/2015  . Nocturia 02/15/2015  . 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 Hypogonadism in male 08/18/2014  . 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 S/P ventriculoperitoneal shunt 10/05/2012  . 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 Internal hemorrhoids 07/27/2012  . 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 Allergic rhinitis 03/11/2012  . Third nerve palsy 02/23/2012  . 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 OSA on CPAP 10/27/2011  . History of adenomatous polyp of colon 08/09/2011     Past Surgical History:  Procedure Laterality Date  . BRAIN SURGERY  2012  . BRAIN TUMOR EXCISION  2002       Home Medications    Prior to Admission medications   Medication Sig Start Date End Date Taking? Authorizing Provider  psyllium (METAMUCIL) 58.6 % powder Take 58.6 packets by mouth as needed.    [provider]  testosterone cypionate (DEPOTESTOSTERONE CYPIONATE) 200 MG/ML injection Inject 0.6 mLs into the muscle once a week. 07/06/14 07/11/20  [provider]    Family History Family History  Problem Relation Age of Onset  . High blood pressure Mother   . Cancer Father        PROSTATE  . Depression Brother     Social History Social History   Tobacco Use  . Smoking status: Current Every Day Smoker  . Smokeless tobacco: Never Used  Vaping Use  . Vaping Use: Never used  Substance Use Topics  . Alcohol use: No  . Drug use: No     Allergies   Patient has no known allergies.   Review of Systems Review of Systems  Neurological: Positive for tremors.     Physical Exam Triage Vital Signs ED Triage Vitals  Enc Vitals Group     BP 07/11/20 1027 121/77     Pulse Rate 07/11/20 1027 60     Resp 07/11/20 1027 16     Temp 07/11/20 1027 97.9 F (36.6 C)     Temp Source 07/11/20 1027  Oral     SpO2 07/11/20 1027 95 %     Weight 07/11/20 1028 185 lb (83.9 kg)     Height 07/11/20 1028 5\' 9"  (1.753 m)     Head Circumference --      Peak Flow --      Pain Score 07/11/20 1028 0     Pain Loc --      Pain Edu? --      Excl. in [place]? --    No data found.  Updated Vital Signs BP 121/77 (BP Location: Right Arm)   Pulse 60   Temp 97.9 F (36.6 C) (Oral)   Resp 16   Ht 5\' 9"  (1.753 m)   Wt 83.9 kg   SpO2 95%   BMI 27.32 kg/m    Physical Exam Vitals and nursing note reviewed.  Constitutional:      Appearance: Normal appearance. He is normal weight.  HENT:     Head: Normocephalic.     Right Ear: Tympanic membrane normal.     Left  Ear: Tympanic membrane normal.     Nose: Nose normal.     Mouth/Throat:     Mouth: Mucous membranes are moist.     Pharynx: Oropharynx is clear.  Eyes:     Extraocular Movements: Extraocular movements intact.     Pupils: Pupils are equal, round, and reactive to light.     Comments: Injected right conjunctiva  Cardiovascular:     Rate and Rhythm: Normal rate and regular rhythm.     Heart sounds: Normal heart sounds.  Pulmonary:     Effort: Pulmonary effort is normal.     Breath sounds: Normal breath sounds.  Musculoskeletal:        General: Normal range of motion.     Cervical back: Normal range of motion and neck supple.  Skin:    General: Skin is warm and dry.  Neurological:     General: No focal deficit present.     Mental Status: He is alert.     Cranial Nerves: No cranial nerve deficit.     Sensory: No sensory deficit.     Coordination: Coordination normal.     Gait: Gait normal.     Deep Tendon Reflexes: Reflexes normal.  Psychiatric:        Mood and Affect: Mood normal.        Thought Content: Thought content normal.      UC Treatments / Results  Labs (all labs ordered are listed, but only abnormal results are displayed) Labs Reviewed  COMPLETE METABOLIC PANEL WITH GFR    EKG   Radiology No results found.  Procedures Procedures (including critical care time)  Medications Ordered in UC Medications - No data to display  Initial Impression / Assessment and Plan / UC Course  I have reviewed the triage vital signs and the nursing notes.  Pertinent labs & imaging results that were available during my care of the patient were reviewed by me and considered in my medical decision making (see chart for details).    Final Clinical Impressions(s) / UC Diagnoses   Final diagnoses:  Fasciculations of muscle     Discharge Instructions     The muscle contractions you experienced today along with the paresthesias can be a manifestation of a Jacksonian  seizure.  There are other possibilities as well that need to be evaluated by your neurologist.  We are running blood test today to rule out a metabolic cause of this,  but I urged you to follow-up with your neurologist.    ED Prescriptions    None     I have reviewed the PDMP during this encounter.   Robyn Haber, MD 07/11/20 1049

## 2020-07-11 NOTE — Discharge Instructions (Addendum)
The muscle contractions you experienced today along with the paresthesias can be a manifestation of a Jacksonian seizure.  There are other possibilities as well that need to be evaluated by your neurologist.  We are running blood test today to rule out a metabolic cause of this, but I urged you to follow-up with your neurologist.

## 2020-07-11 NOTE — ED Triage Notes (Addendum)
Woke up this morning went down to get a cup of coffee and both legs started spasm and shake.Lasted about 5 minutes. Feels fine now. Vaccinated

## 2021-08-16 ENCOUNTER — Encounter: Payer: Self-pay | Admitting: Emergency Medicine

## 2021-08-16 ENCOUNTER — Other Ambulatory Visit: Payer: Self-pay

## 2021-08-16 ENCOUNTER — Emergency Department (INDEPENDENT_AMBULATORY_CARE_PROVIDER_SITE_OTHER)
Admission: EM | Admit: 2021-08-16 | Discharge: 2021-08-16 | Disposition: A | Discharge status: Home / Self Care | Payer: Managed Care, Other (non HMO) | Source: Home / Self Care

## 2021-08-16 DIAGNOSIS — K219 Gastro-esophageal reflux disease without esophagitis: Secondary | ICD-10-CM

## 2021-08-16 DIAGNOSIS — L989 Disorder of the skin and subcutaneous tissue, unspecified: Secondary | ICD-10-CM

## 2021-08-16 DIAGNOSIS — R198 Other specified symptoms and signs involving the digestive system and abdomen: Secondary | ICD-10-CM | POA: Diagnosis not present

## 2021-08-16 DIAGNOSIS — R6881 Early satiety: Secondary | ICD-10-CM

## 2021-08-16 MED ORDER — MUPIROCIN 2 % EX OINT: 1.0000 "application " | TOPICAL_OINTMENT | Freq: Every day | CUTANEOUS | 0 refills | Status: AC

## 2021-08-16 MED ORDER — PANTOPRAZOLE SODIUM 20 MG PO TBEC: 20.0000 mg | DELAYED_RELEASE_TABLET | Freq: Every day | ORAL | 0 refills | Status: AC

## 2021-08-16 NOTE — Discharge Instructions (Signed)
Start Protonx. Follow up with GI as scheduled. If you have any severe abdominal pain or worsening symptoms please go to the ER.  Use cream on lesion. If it gets bigger or more painful return for reevaluation.

## 2021-08-16 NOTE — ED Triage Notes (Signed)
Pt c/o one month of feeling a lack of appetite and stomach fullness. Tried linzess from primary care with no relief.

## 2021-08-16 NOTE — ED Provider Notes (Signed)
[name]    CSN: [id number] Arrival date & time: 08/16/21  1957      History   Chief Complaint Chief Complaint  Patient presents with   GI Problem    HPI Brandon Huerta is a 55 y.o. male.   Patient presents today with a month-long history of early satiety.  He reports feeling as though his abdomen is full and he is not completely emptying his bowels despite regular bowel movements.  Reports his last bowel movement was earlier today and was described as 3 small bowel movements.  He does have a history of constipation but reports current symptoms are different than previous episodes of this.  He has been using MiraLAX, Metamucil, Linzess without improvement.  He does report taking small doses of opioids but this is only been for the past week and symptoms have been going on even prior to that.  He denies any fever, abdominal pain, nausea, vomiting.  He denies any blood in his stool or dark stools.  He does have an appointment scheduled with GI specialist for 8 days from now but wanted to be evaluated sooner.  He was seen by his PCP approximately 6 days ago and had normal work-up including blood work such as CBC, CMP, thyroid.  He reports associated fatigue and feeling as though there is something wrong.  He does report an occasional dry cough.  Denies history of GERD and has not had endoscopy in the past.  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  In addition, patient reports a several week history of a lesion on his left posterior thigh.  He reports this has become less painful recently but is still present.  He has not been applying any medication for this.  Denies history of immunosuppression or recurrent skin infections.  Denies any recent antibiotic use.   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 Authorizing Provider  mupirocin ointment (BACTROBAN) 2 % Apply 1 application topically daily. 08/16/21  Yes ,  K, PA-C  pantoprazole (PROTONIX) 20 MG tablet Take 1 tablet (20 mg total) by mouth daily. 08/16/21  Yes ,  K, PA-C  psyllium (METAMUCIL) 58.6 % powder Take 58.6 packets by mouth as needed.    [provider]  testosterone cypionate (DEPOTESTOSTERONE CYPIONATE) 200 MG/ML injection Inject 0.6  mLs into the muscle once a week. 07/06/14 07/11/20  [provider]    Family History Family History  Problem Relation Age of Onset   High blood pressure Mother    Cancer Father        PROSTATE   Depression Brother     Social History Social History   Tobacco Use   Smoking status: Every Day   Smokeless tobacco: Never  Vaping Use   Vaping Use: Never used  Substance Use Topics   Alcohol use: No   Drug use: No     Allergies   Patient has no known allergies.   Review of Systems Review of Systems   Constitutional:  Positive for activity change and fatigue. Negative for appetite change and fever.  HENT:  Negative for congestion and sore throat.   Respiratory:  Positive for cough. Negative for shortness of breath.   Cardiovascular:  Negative for chest pain.  Gastrointestinal:  Positive for constipation. Negative for abdominal pain, blood in stool, diarrhea, nausea and vomiting.  Musculoskeletal:  Negative for arthralgias and myalgias.    Physical Exam Triage Vital Signs ED Triage Vitals [08/16/21 2007]  Enc Vitals Group     BP (!) 143/94     Pulse Rate 93     Resp 18     Temp 98 F (36.7 C)     Temp Source Oral     SpO2 96 %     Weight      Height      Head Circumference      Peak Flow      Pain Score 0     Pain Loc      Pain Edu?      Excl. in [place]?    No data found.  Updated Vital Signs BP (!) 143/94 (BP Location: Right Arm)    Pulse 93    Temp 98 F (36.7 C) (Oral)    Resp 18    SpO2 96%   Visual Acuity Right Eye Distance:   Left Eye Distance:   Bilateral Distance:    Right Eye Near:   Left Eye Near:    Bilateral Near:     Physical Exam Vitals reviewed.  Constitutional:      General: He is awake.     Appearance: Normal appearance. He is well-developed. He is not ill-appearing.     Comments: Appears stated age in no acute distress sitting comfortably in exam room  HENT:     Head: Normocephalic and atraumatic.     Mouth/Throat:     Pharynx: No oropharyngeal exudate, posterior oropharyngeal erythema or uvula swelling.  Cardiovascular:     Rate and Rhythm: Normal rate and regular rhythm.     Heart sounds: Normal heart sounds, S1 normal and S2 normal. No murmur heard. Pulmonary:     Effort: Pulmonary effort is normal.     Breath sounds: Normal breath sounds. No stridor. No wheezing, rhonchi or rales.     Comments: Clear to auscultation bilaterally Abdominal:     General: Bowel sounds are normal.     Palpations: Abdomen is soft.     Tenderness: There is  no abdominal tenderness.     Comments: Benign abdominal exam.  No evidence of acute infection on physical exam.  Skin:         Comments: 2 centimeter by 1 cm erythematous lesion with central crusting noted posterior left leg.  No active bleeding or drainage noted.  Area is not warm  or tender to touch.  No streaking or evidence of lymphangitis.  Neurological:     Mental Status: He is alert.  Psychiatric:        Behavior: Behavior is cooperative.     UC Treatments / Results  Labs (all labs ordered are listed, but only abnormal results are displayed) Labs Reviewed - No data to display  EKG   Radiology No results found.  Procedures Procedures (including critical care time)  Medications Ordered in UC Medications - No data to display  Initial Impression / Assessment and Plan / UC Course  I have reviewed the triage vital signs and the nursing notes.  Pertinent labs & imaging results that were available during my care of the patient were reviewed by me and considered in my medical decision making (see chart for details).     Vital signs and physical exam reassuring today; no indication for emergent evaluation or imaging.  Patient has had normal blood work within the past week so this was not repeated today given symptoms of been present for 4-week without any recent change/worsening.  Unclear etiology of symptoms.  Discussed possible silent GERD as etiology of symptoms given clinical presentation but ultimately he would need to see a GI specialist to consider endoscopy and rule out ulcer or H. pylori.  We will start Protonix to help manage symptoms.  Patient is scheduled to see GI next week and will strongly encouraged to keep this appointment.  Recommend he continue bowel regimen to ensure constipation is not contributing to symptoms.  Discussed alarm symptoms that warrant emergent evaluation including abdominal pain, nausea/vomiting, blood in stool.  Strict return precautions given to  which he expressed understanding.  Lesion appears to be healing appropriately with no evidence of acute infection.  Patient was encouraged to keep area clean and apply Bactroban with dressing changes.  Discussed that if this becomes larger, painful, swollen or he develops any systemic symptoms he needs to be reevaluated to consider antibiotic use.  Strict return precautions given to which he expressed understanding.  Final Clinical Impressions(s) / UC Diagnoses   Final diagnoses:  Gastroesophageal reflux disease, unspecified whether esophagitis present  Early satiety  Abdominal fullness  Skin lesion     Discharge Instructions      Start Protonx. Follow up with GI as scheduled. If you have any severe abdominal pain or worsening symptoms please go to the ER.  Use cream on lesion. If it gets bigger or more painful return for reevaluation.     ED Prescriptions     Medication Sig Dispense Auth. Provider   pantoprazole (PROTONIX) 20 MG tablet Take 1 tablet (20 mg total) by mouth daily. 30 tablet ,  K, PA-C   mupirocin ointment (BACTROBAN) 2 % Apply 1 application topically daily. 22 g ,  K, PA-C      PDMP not reviewed this encounter.   Terrilee Croak, PA-C 08/16/21 2035

## 2023-03-24 ENCOUNTER — Other Ambulatory Visit: Payer: Self-pay | Admitting: Internal Medicine

## 2023-03-24 DIAGNOSIS — C499 Malignant neoplasm of connective and soft tissue, unspecified: Secondary | ICD-10-CM
